# Patient Record
Sex: Female | Born: 1980 | Race: White | Hispanic: No | Marital: Married | Smoking: Never smoker
Health system: Southern US, Community
[De-identification: ages and names within clinical notes are randomized; demographics above are authoritative.]

## PROBLEM LIST (undated history)

## (undated) DIAGNOSIS — C801 Malignant (primary) neoplasm, unspecified: Secondary | ICD-10-CM

## (undated) DIAGNOSIS — G43909 Migraine, unspecified, not intractable, without status migrainosus: Secondary | ICD-10-CM

---
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
## 2005-10-15 ENCOUNTER — Emergency Department (HOSPITAL_COMMUNITY): Admission: EM | Admit: 2005-10-15 | Discharge: 2005-10-15 | Payer: Self-pay | Admitting: Emergency Medicine

## 2005-11-27 ENCOUNTER — Emergency Department (HOSPITAL_COMMUNITY): Admission: EM | Admit: 2005-11-27 | Discharge: 2005-11-27 | Payer: Self-pay | Admitting: Emergency Medicine

## 2006-02-10 ENCOUNTER — Emergency Department (HOSPITAL_COMMUNITY): Admission: EM | Admit: 2006-02-10 | Discharge: 2006-02-10 | Payer: Self-pay | Admitting: Emergency Medicine

## 2006-05-12 ENCOUNTER — Inpatient Hospital Stay (HOSPITAL_COMMUNITY): Admission: RE | Admit: 2006-05-12 | Discharge: 2006-05-15 | Payer: Self-pay | Admitting: *Deleted

## 2006-05-13 ENCOUNTER — Ambulatory Visit: Payer: Self-pay | Admitting: *Deleted
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
## 2018-10-07 ENCOUNTER — Telehealth (HOSPITAL_COMMUNITY): Payer: Self-pay | Admitting: Psychiatry

## 2020-02-10 ENCOUNTER — Other Ambulatory Visit: Payer: Self-pay | Admitting: *Deleted

## 2020-02-10 DIAGNOSIS — R1010 Upper abdominal pain, unspecified: Secondary | ICD-10-CM

## 2020-02-14 ENCOUNTER — Other Ambulatory Visit: Payer: Self-pay

## 2020-10-28 ENCOUNTER — Other Ambulatory Visit: Payer: Self-pay

## 2020-10-28 ENCOUNTER — Emergency Department (HOSPITAL_BASED_OUTPATIENT_CLINIC_OR_DEPARTMENT_OTHER): Payer: Medicaid Other

## 2020-10-28 ENCOUNTER — Encounter (HOSPITAL_BASED_OUTPATIENT_CLINIC_OR_DEPARTMENT_OTHER): Payer: Self-pay | Admitting: Emergency Medicine

## 2020-10-28 ENCOUNTER — Emergency Department (HOSPITAL_BASED_OUTPATIENT_CLINIC_OR_DEPARTMENT_OTHER)
Admission: EM | Admit: 2020-10-28 | Discharge: 2020-10-28 | Disposition: A | Discharge status: Home / Self Care | Payer: Medicaid Other | Attending: Emergency Medicine | Admitting: Emergency Medicine

## 2020-10-28 DIAGNOSIS — Z5321 Procedure and treatment not carried out due to patient leaving prior to being seen by health care provider: Secondary | ICD-10-CM | POA: Insufficient documentation

## 2020-10-28 DIAGNOSIS — R202 Paresthesia of skin: Secondary | ICD-10-CM | POA: Diagnosis not present

## 2020-10-28 DIAGNOSIS — R519 Headache, unspecified: Secondary | ICD-10-CM | POA: Insufficient documentation

## 2020-10-28 HISTORY — DX: Migraine, unspecified, not intractable, without status migrainosus: G43.909

## 2020-10-28 HISTORY — DX: Malignant (primary) neoplasm, unspecified: C80.1

## 2020-10-28 NOTE — ED Provider Notes (Signed)
MSE was initiated and I personally evaluated the patient and placed orders (if any) at  3:55 PM on October 28, 2020.  The patient appears stable so that the remainder of the MSE may be completed by another provider.  Patient referred to the emergency department from urgent care for evaluation of headache, numbness. Headache started two days ago and is typical for her migraines. Now she is having right upper extremity numbness as well, which is atypical for her migraines. She is out of her Imitrex. Numbness started around 10 AM. She is not a code stroke candidate. Discussed with patient recommendation for CT head.   Quintella Reichert, MD 10/28/20 [phone]

## 2020-10-28 NOTE — ED Triage Notes (Addendum)
Headache for 2 days. States she had confusion and R arm tingling at 11am that lasted 10 minutes. She was sent from [facility]. States she feels "spacey". She had a decadron injection at [facility]

## 2020-10-28 NOTE — ED Notes (Signed)
ED Provider at bedside. 

## 2020-10-28 NOTE — ED Notes (Signed)
Called for pt in waiting room; no response; door attendant stated she saw her walk outside -- could not locate pt.  Informed triage nurse
